# Patient Record
Sex: Male | Born: 1990 | Race: White | Hispanic: No | Marital: Single
Health system: Southern US, Community
[De-identification: ages and names within clinical notes are randomized; demographics above are authoritative.]

---

## 2021-01-26 ENCOUNTER — Emergency Department (HOSPITAL_COMMUNITY)
Admission: EM | Admit: 2021-01-26 | Discharge: 2021-01-26 | Disposition: A | Discharge status: Home / Self Care | Payer: BC Managed Care – PPO | Attending: Emergency Medicine | Admitting: Emergency Medicine

## 2021-01-26 ENCOUNTER — Emergency Department (HOSPITAL_COMMUNITY): Payer: BC Managed Care – PPO

## 2021-01-26 ENCOUNTER — Other Ambulatory Visit: Payer: Self-pay

## 2021-01-26 DIAGNOSIS — R4589 Other symptoms and signs involving emotional state: Secondary | ICD-10-CM

## 2021-01-26 DIAGNOSIS — F419 Anxiety disorder, unspecified: Secondary | ICD-10-CM | POA: Diagnosis not present

## 2021-01-26 DIAGNOSIS — M545 Low back pain, unspecified: Secondary | ICD-10-CM | POA: Diagnosis not present

## 2021-01-26 DIAGNOSIS — F22 Delusional disorders: Secondary | ICD-10-CM | POA: Diagnosis not present

## 2021-01-26 DIAGNOSIS — M549 Dorsalgia, unspecified: Secondary | ICD-10-CM | POA: Diagnosis present

## 2021-01-26 DIAGNOSIS — F418 Other specified anxiety disorders: Secondary | ICD-10-CM

## 2021-01-26 DIAGNOSIS — R200 Anesthesia of skin: Secondary | ICD-10-CM | POA: Diagnosis not present

## 2021-01-26 LAB — CBC WITH DIFFERENTIAL/PLATELET
Abs Immature Granulocytes: 0.05 10*3/uL (ref 0.00–0.07)
Basophils Absolute: 0 10*3/uL (ref 0.0–0.1)
Basophils Relative: 0 %
Eosinophils Absolute: 0 10*3/uL (ref 0.0–0.5)
Eosinophils Relative: 0 %
HCT: 46.1 % (ref 39.0–52.0)
Hemoglobin: 15.5 g/dL (ref 13.0–17.0)
Immature Granulocytes: 0 %
Lymphocytes Relative: 18 %
Lymphs Abs: 2.4 10*3/uL (ref 0.7–4.0)
MCH: 30.2 pg (ref 26.0–34.0)
MCHC: 33.6 g/dL (ref 30.0–36.0)
MCV: 89.9 fL (ref 80.0–100.0)
Monocytes Absolute: 1.3 10*3/uL — ABNORMAL HIGH (ref 0.1–1.0)
Monocytes Relative: 10 %
Neutro Abs: 9.3 10*3/uL — ABNORMAL HIGH (ref 1.7–7.7)
Neutrophils Relative %: 72 %
Platelets: 464 10*3/uL — ABNORMAL HIGH (ref 150–400)
RBC: 5.13 MIL/uL (ref 4.22–5.81)
RDW: 11.7 % (ref 11.5–15.5)
WBC: 13.1 10*3/uL — ABNORMAL HIGH (ref 4.0–10.5)
nRBC: 0 % (ref 0.0–0.2)

## 2021-01-26 LAB — BASIC METABOLIC PANEL
Anion gap: 12 (ref 5–15)
BUN: 16 mg/dL (ref 6–20)
CO2: 24 mmol/L (ref 22–32)
Calcium: 10.1 mg/dL (ref 8.9–10.3)
Chloride: 102 mmol/L (ref 98–111)
Creatinine, Ser: 1.13 mg/dL (ref 0.61–1.24)
GFR, Estimated: >60 mL/min (ref >60)
Glucose, Bld: 113 mg/dL — ABNORMAL HIGH (ref 70–99)
Potassium: 4 mmol/L (ref 3.5–5.1)
Sodium: 138 mmol/L (ref 135–145)

## 2021-01-26 IMAGING — MR MR CERVICAL SPINE W/O CM
4 of 5 series · 19 of 48 positions shown · non-contrast
Comparison: None.

CLINICAL DATA: Right upper quadrant paresthesias intermittently

EXAM:
MRI CERVICAL SPINE WITHOUT CONTRAST
TECHNIQUE: Multiplanar, multisequence MR imaging of the cervical spine was
performed. No intravenous contrast was administered.

[Series 3: T2 · sagittal · 3.0mm · 0.43mm/px · 4 of 16 slices shown (1 of 2)]
[im 1/16]
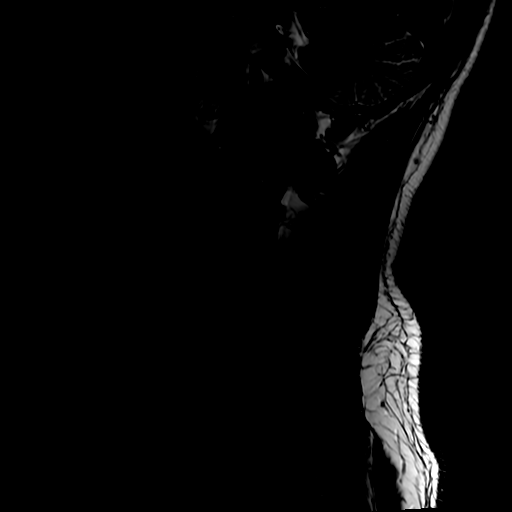
[im 6/16]
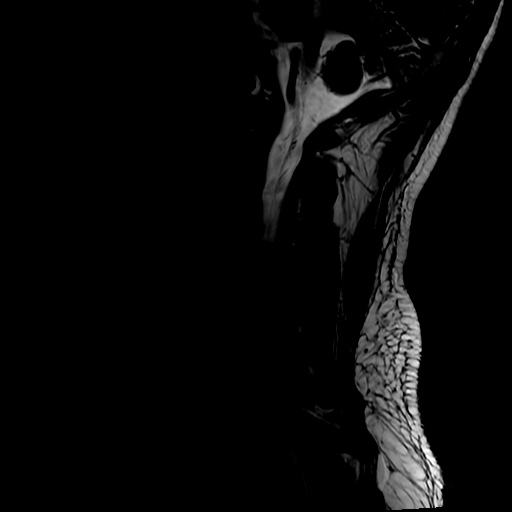
[im 11/16]
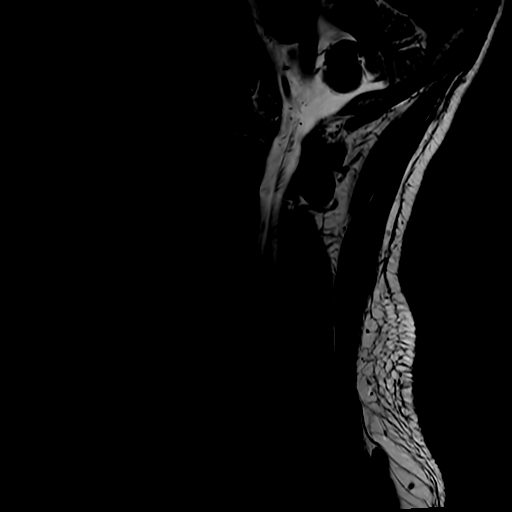
[im 16/16]
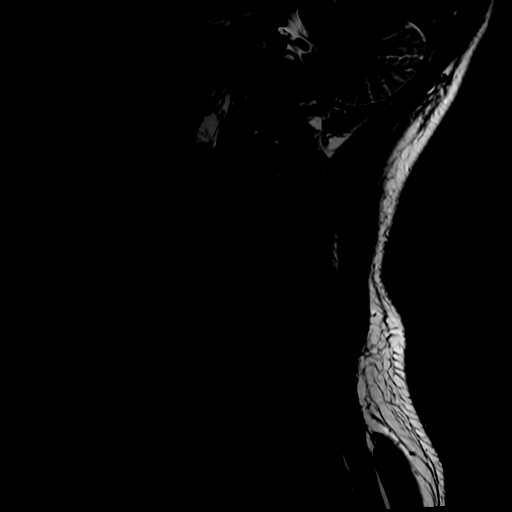

[Series 4: FLAIR · sagittal · 3.0mm · 0.43mm/px · 3 of 16 slices shown]
[im 1/16]
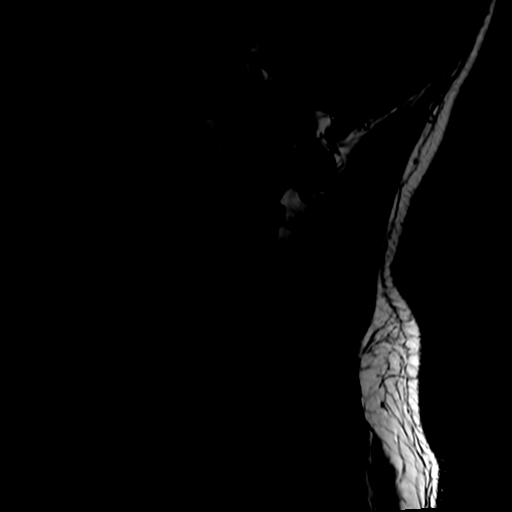
[im 8/16]
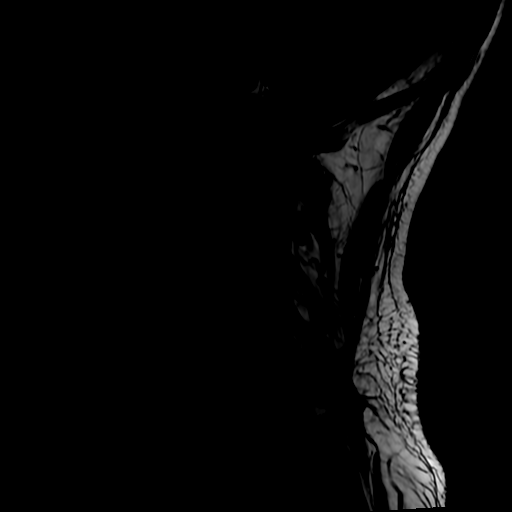
[im 16/16]
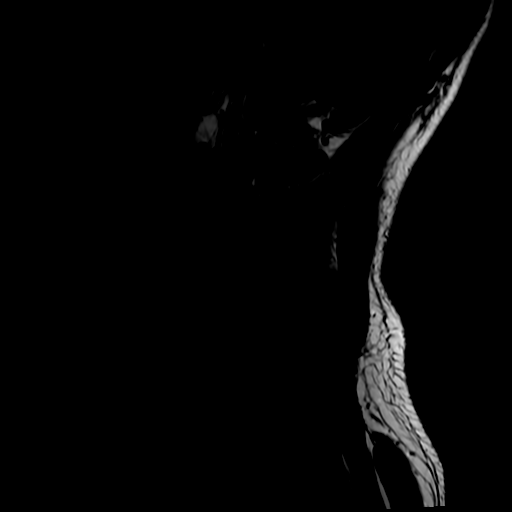

[Series 5: STIR · sagittal · 3.0mm · 0.43mm/px · 3 of 16 slices shown]
[im 1/16]
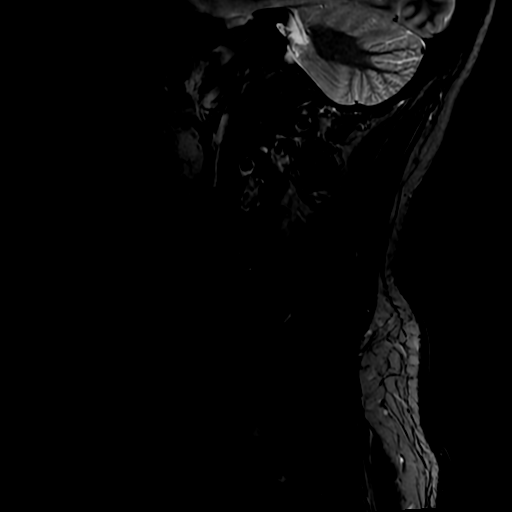
[im 8/16]
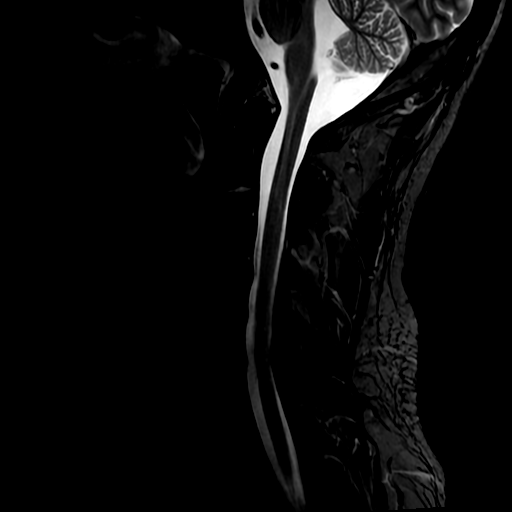
[im 16/16]
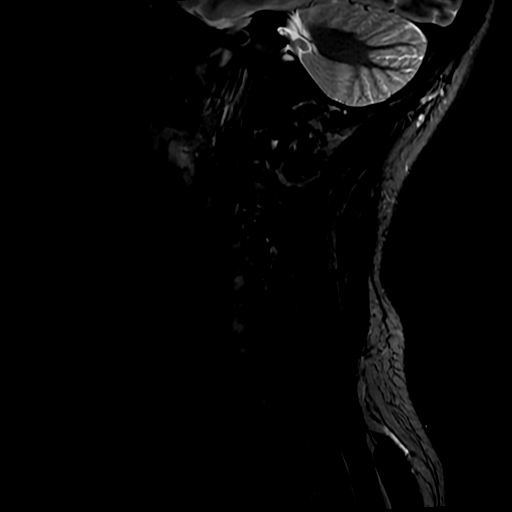

[Series 7: T2 · axial · 3.0mm · 0.35mm/px · z∈[-103,-2]mm · 9 of 32 slices shown (2 of 2)]
[im 1/32]
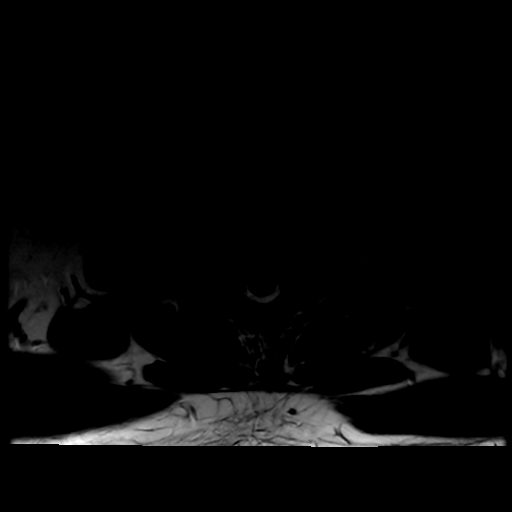
[im 4/32]
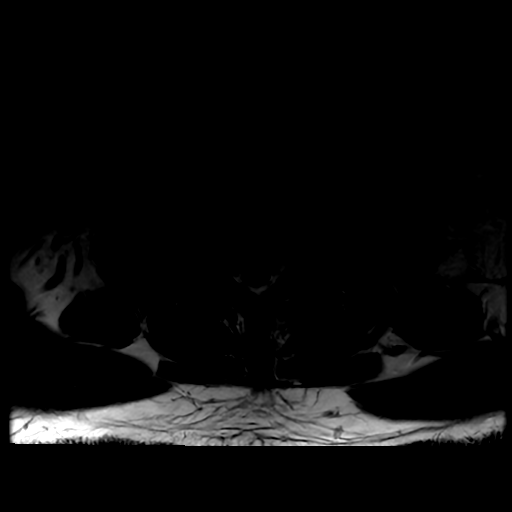
[im 8/32]
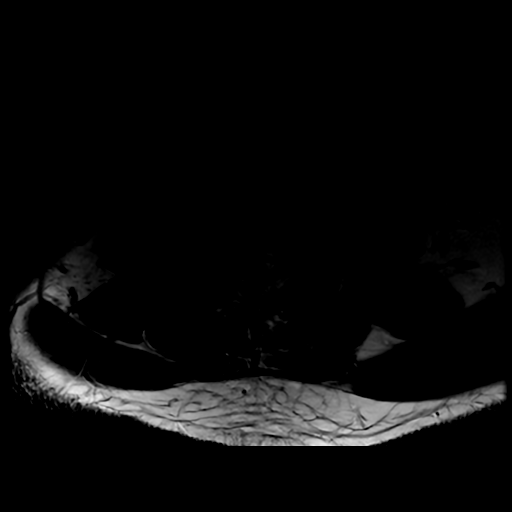
[im 12/32]
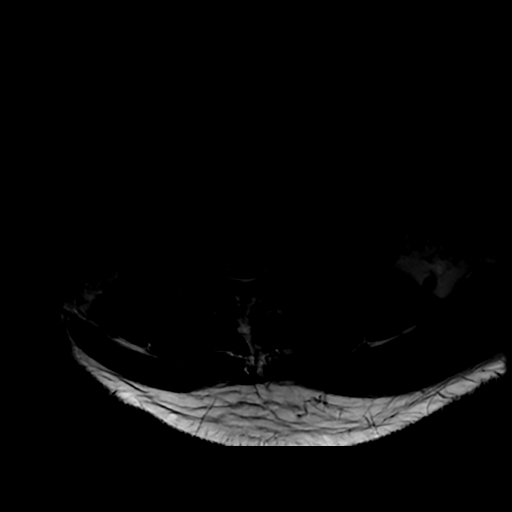
[im 16/32]
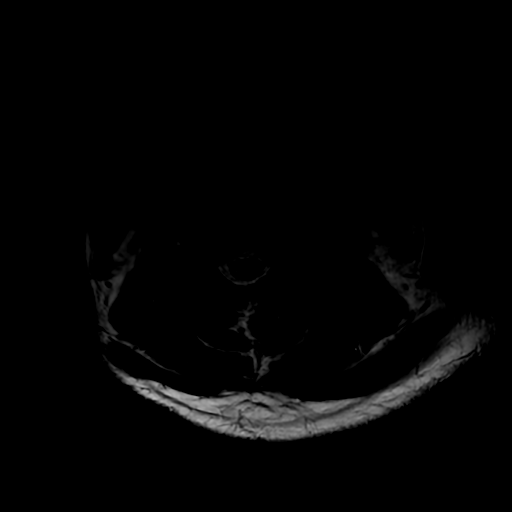
[im 20/32]
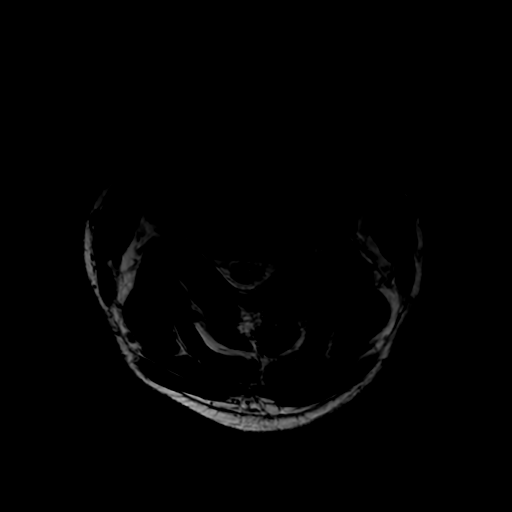
[im 24/32]
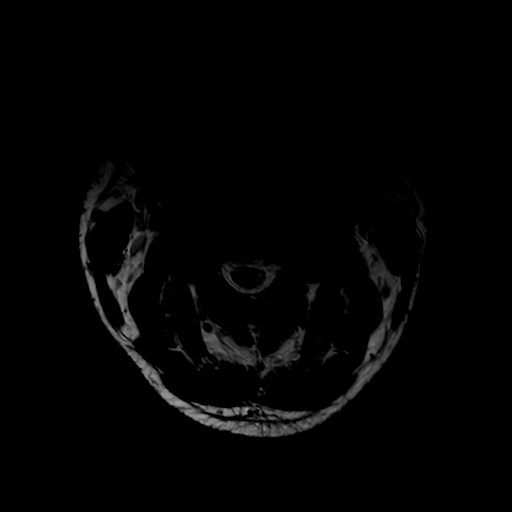
[im 28/32]
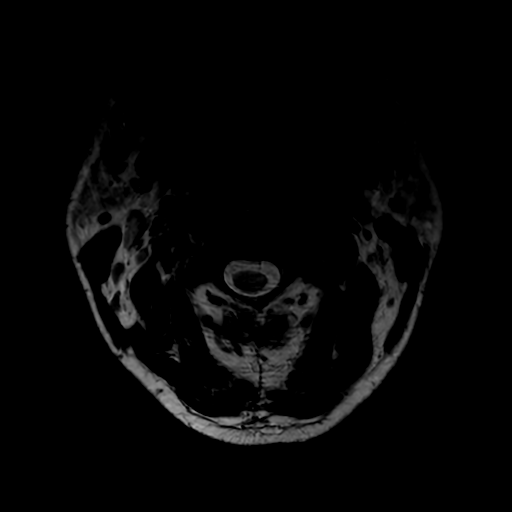
[im 32/32]
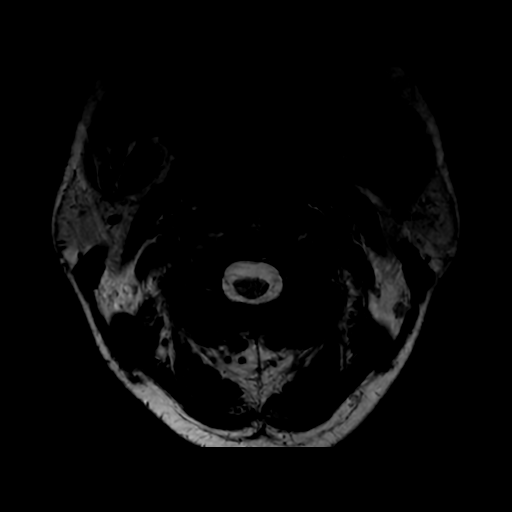

[19 of 48 positions shown; findings below may reference images not displayed]

FINDINGS: Alignment: Normal

Vertebrae: Normal bone marrow.  Negative for fracture or mass

Cord: Spinal cord evaluation limited by motion particularly on the
axial images. Allowing for motion, no cord signal abnormality.

Posterior Fossa, vertebral arteries, paraspinal tissues: Negative

Disc levels:

C2-3: Negative

C3-4: Negative

C4-5: Mild disc degeneration and mild left-sided uncinate spurring.
Mild left foraminal narrowing. Right foramen widely patent

C5-6: Negative

C6-7: Negative

C7-T1: Negative
IMPRESSION: Motion degraded study.

Mild disc degeneration and spurring on the left at C4-5. Mild left
foraminal narrowing

## 2021-01-26 IMAGING — CT CT HEAD W/O CM
4 series · 16 of 47 positions shown, 18 images · non-contrast
Comparison: No pertinent prior exams available for comparison.

CLINICAL DATA: Provided history: Anxious.  Anxiety.

EXAM:
CT HEAD WITHOUT CONTRAST
TECHNIQUE: Contiguous axial images were obtained from the base of the skull
through the vertex without intravenous contrast.

[Series 3: head without · axial · non-contrast · 0.44mm/px · z∈[-143,-13]mm · 7 of 36 slices shown, 9 images]
[im 5/36  brain]
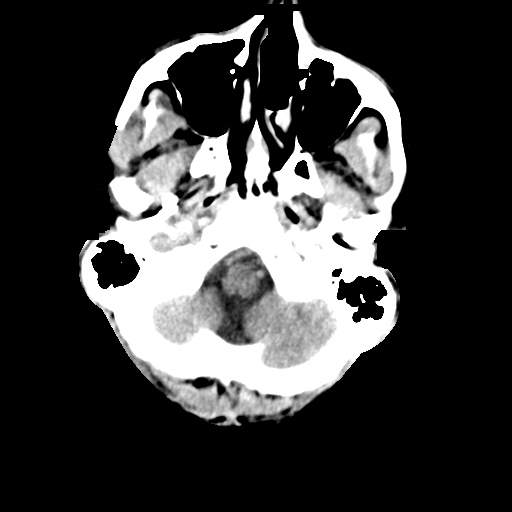
[im 5/36  bone]
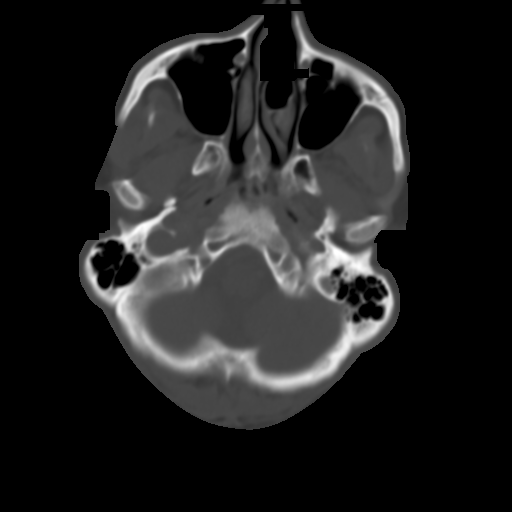
[im 9/36  brain]
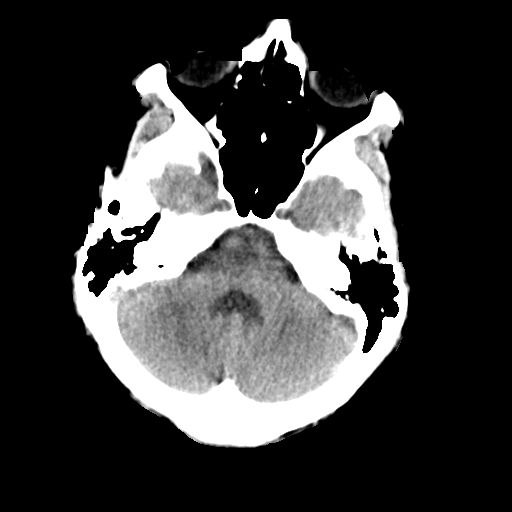
[im 14/36  brain]
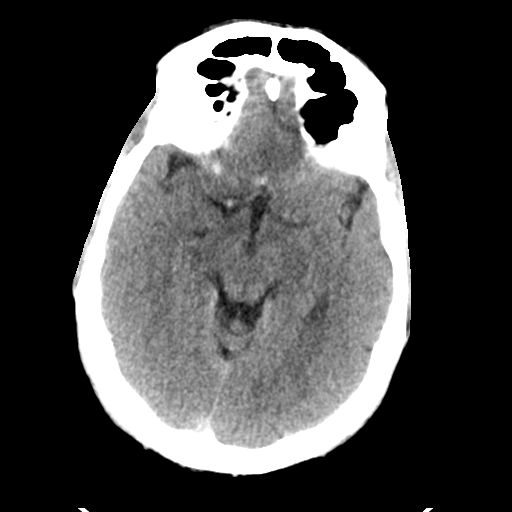
[im 18/36  brain]
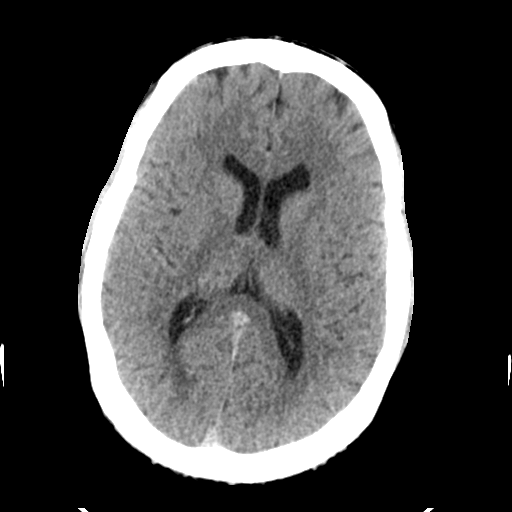
[im 22/36  brain]
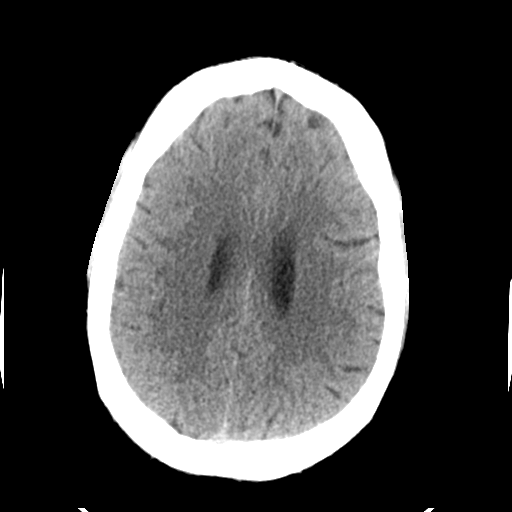
[im 22/36  bone]
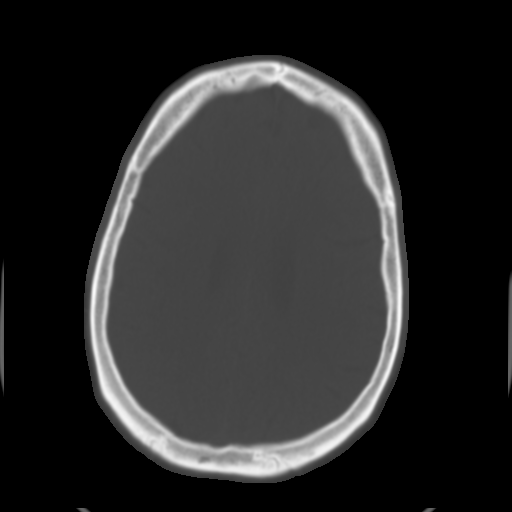
[im 27/36  brain]
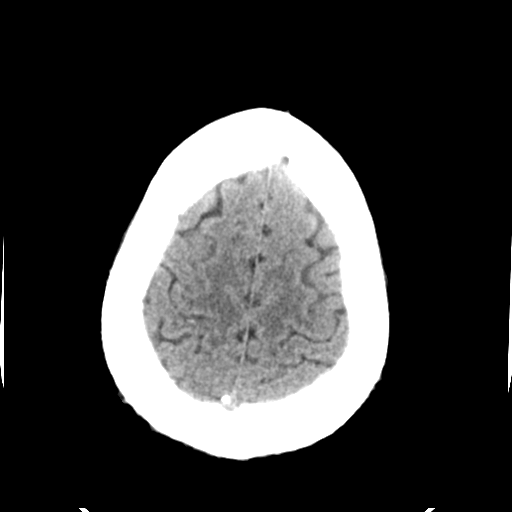
[im 31/36  brain]
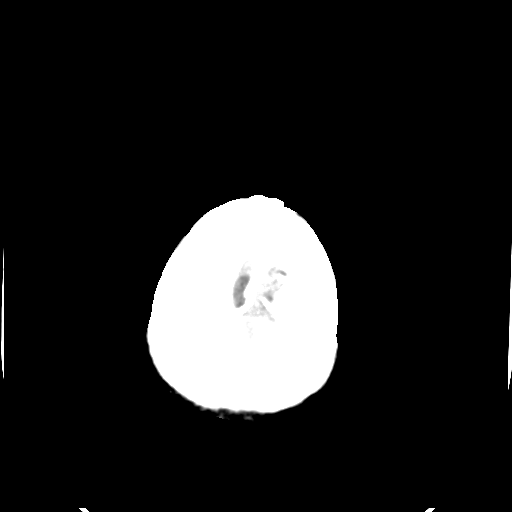

[Series 4: head bone · axial · 0.44mm/px · z∈[-147,-111]mm · 3 of 88 slices shown]
[im 9/88  bone]
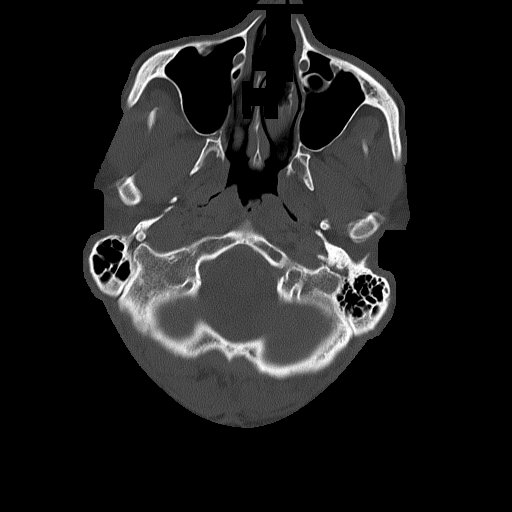
[im 18/88  bone]
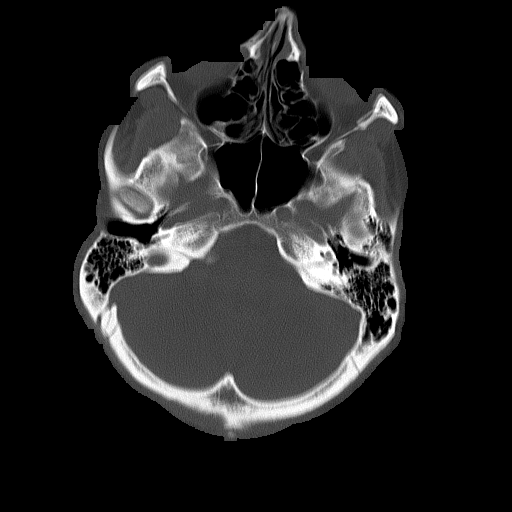
[im 27/88  bone]
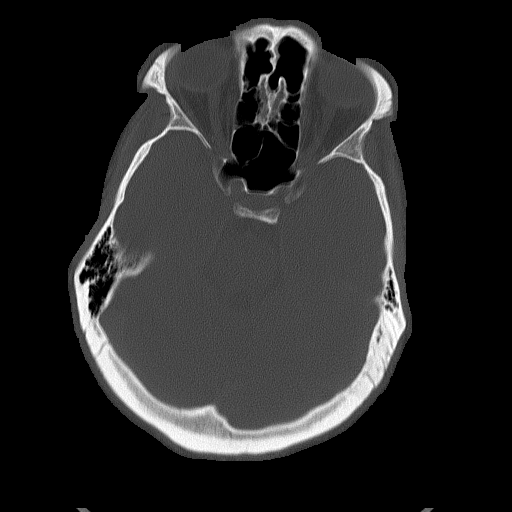

[Series 5: head without cor · coronal · non-contrast · 0.34mm/px · 3 of 76 slices shown]
[im 26/76  brain]
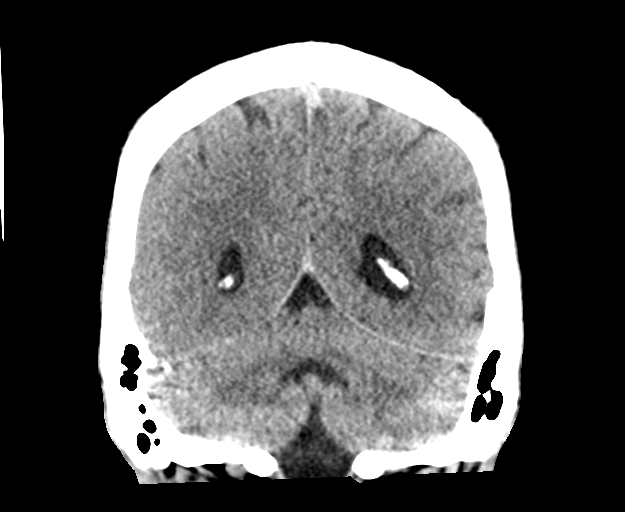
[im 34/76  brain]
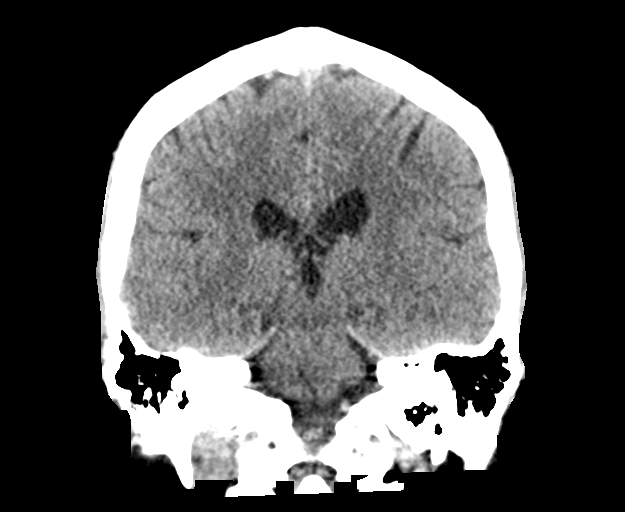
[im 42/76  brain]
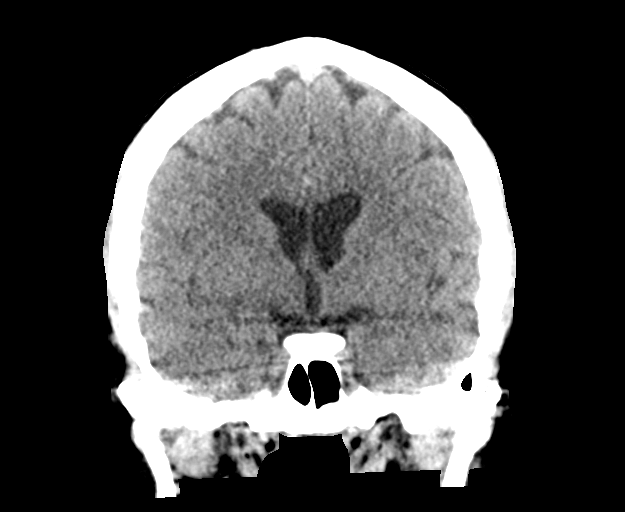

[Series 6: head without sag · sagittal · non-contrast · 0.38mm/px · 3 of 65 slices shown]
[im 22/65  brain]
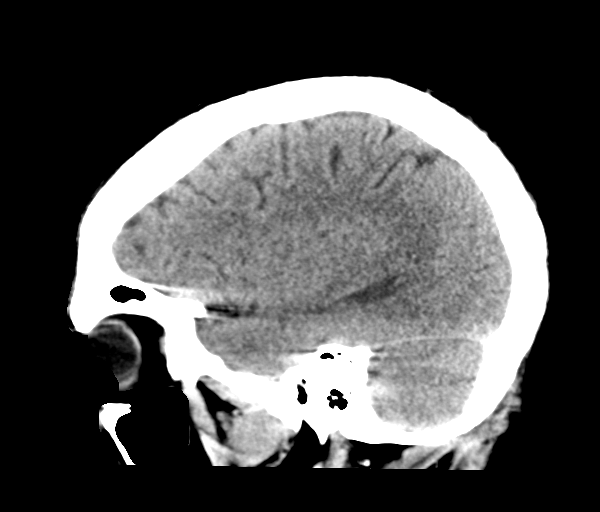
[im 33/65  brain]
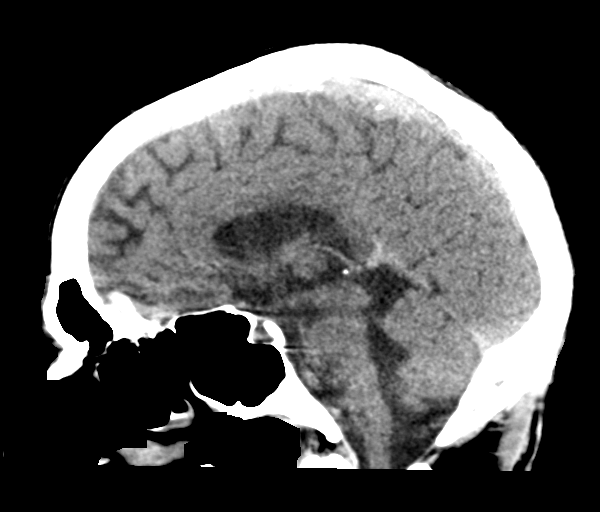
[im 43/65  brain]
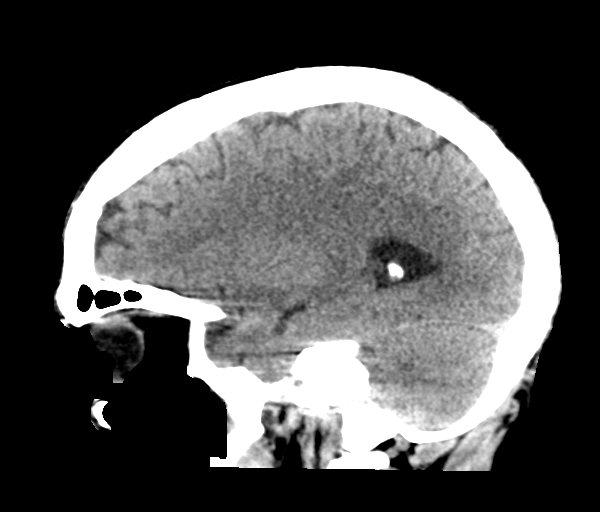

[16 of 47 positions shown; findings below may reference images not displayed]

FINDINGS: Brain:

Cerebral volume is normal.

There is no acute intracranial hemorrhage.

No demarcated cortical infarct.

No extra-axial fluid collection.

No evidence of intracranial mass.

No midline shift.

Vascular: Hyperdense vessel.

Skull: Normal. Negative for fracture or focal lesion.

Sinuses/Orbits: Visualized orbits show no acute finding. Tiny right
maxillary sinus mucous retention cyst.
IMPRESSION: Unremarkable non-contrast CT appearance of the brain. No evidence of
acute intracranial abnormality.

Incidentally noted tiny right maxillary sinus mucous retention cyst.

## 2021-01-26 IMAGING — MR MR LUMBAR SPINE W/O CM
4 of 5 series · 19 of 48 positions shown · non-contrast
Comparison: None.

CLINICAL DATA: Low back pain, cauda equina suspected.

EXAM:
MRI LUMBAR SPINE WITHOUT CONTRAST
TECHNIQUE: Multiplanar, multisequence MR imaging of the lumbar spine was
performed. No intravenous contrast was administered.

[Series 3: T2 · sagittal · 4.0mm · 0.55mm/px · 6 of 14 slices shown (1 of 2)]
[im 1/14]
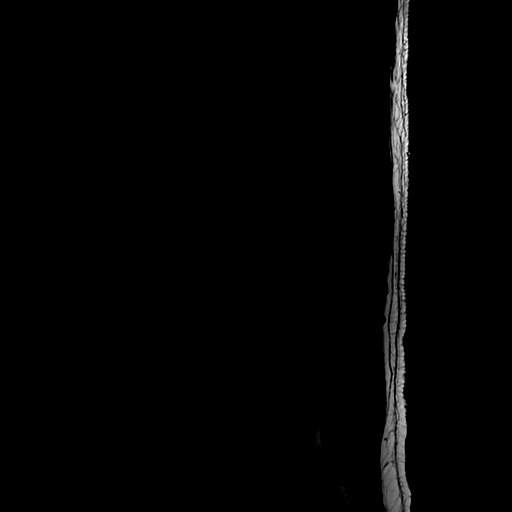
[im 3/14]
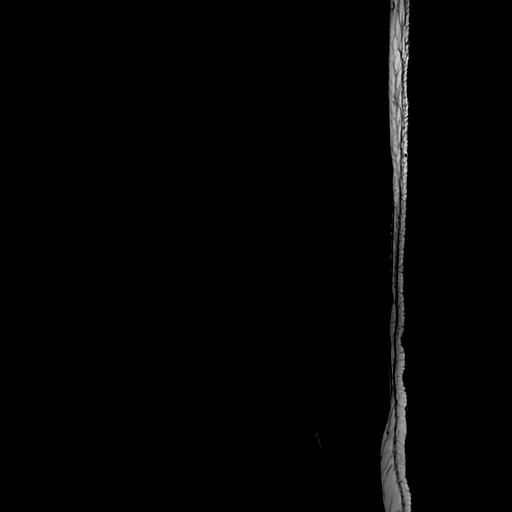
[im 6/14]
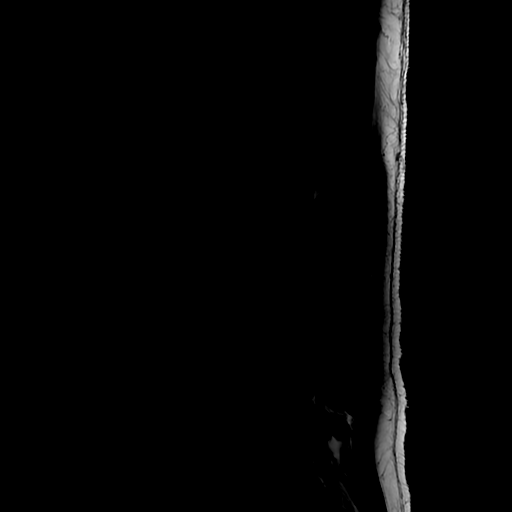
[im 8/14]
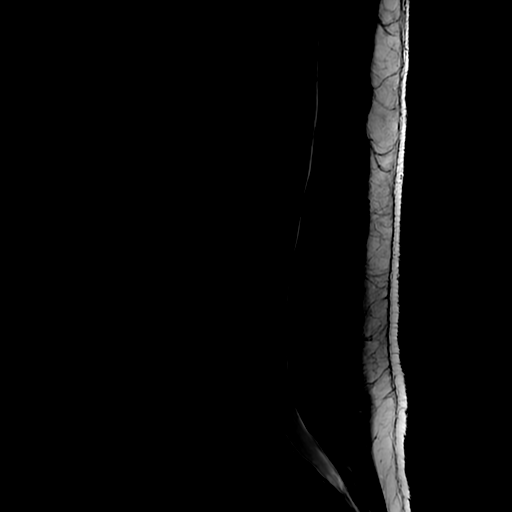
[im 11/14]
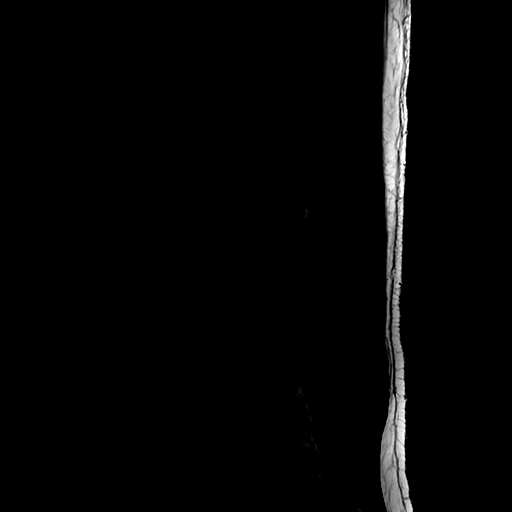
[im 14/14]
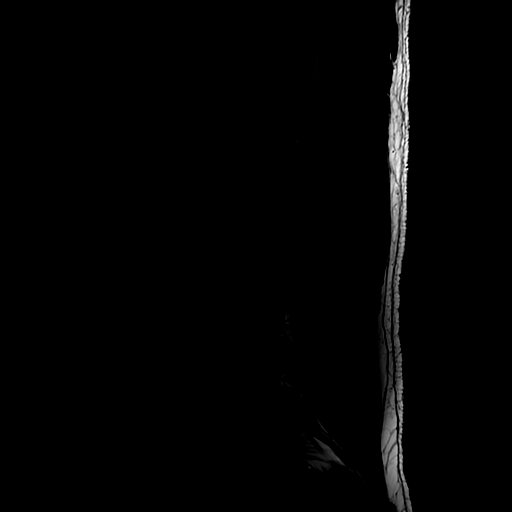

[Series 5: T1 · sagittal · 4.0mm · 0.55mm/px · 3 of 15 slices shown (1 of 2)]
[im 3/15]
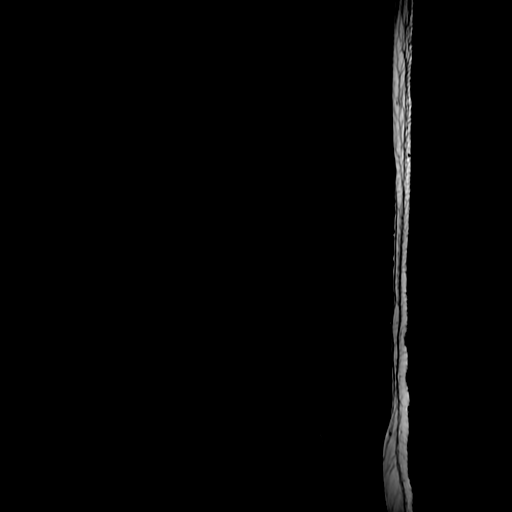
[im 9/15]
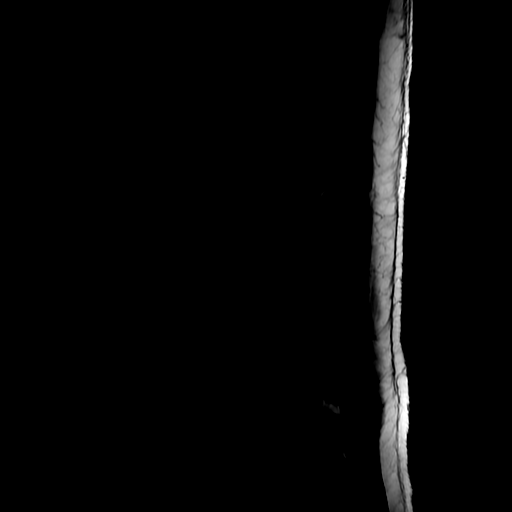
[im 15/15]
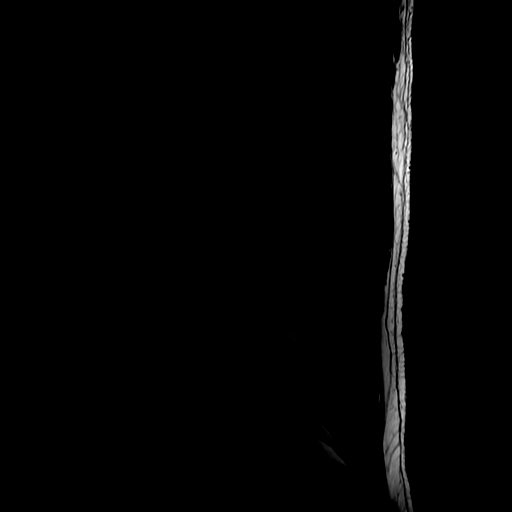

[Series 6: T2 · axial · 4.0mm · 0.39mm/px · z∈[-566,-394]mm · 7 of 35 slices shown (2 of 2)]
[im 1/35]
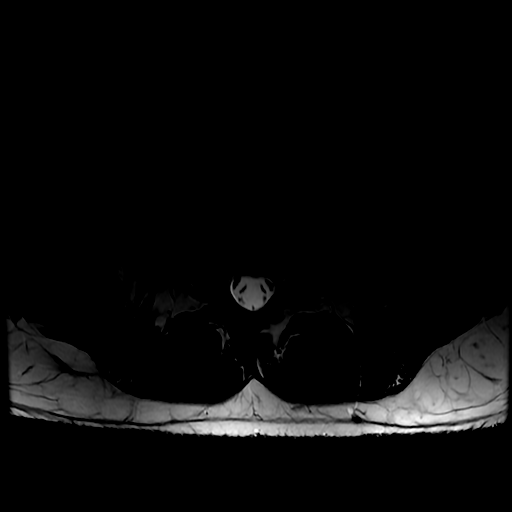
[im 5/35]
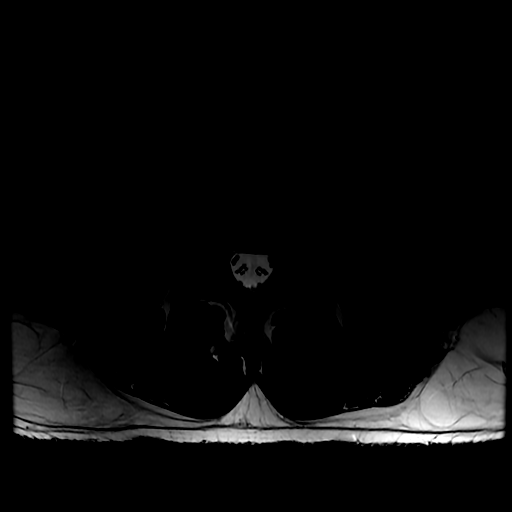
[im 10/35]
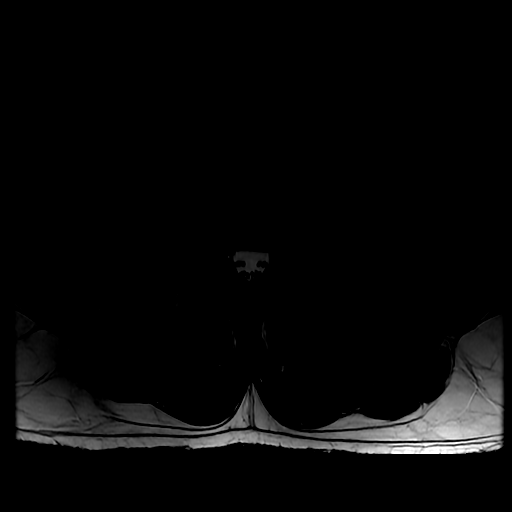
[im 15/35]
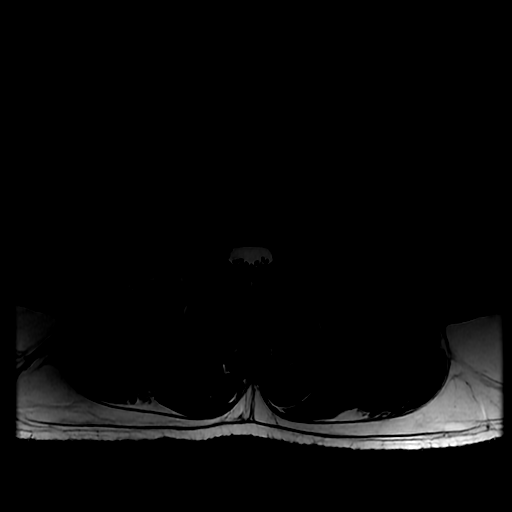
[im 18/35]
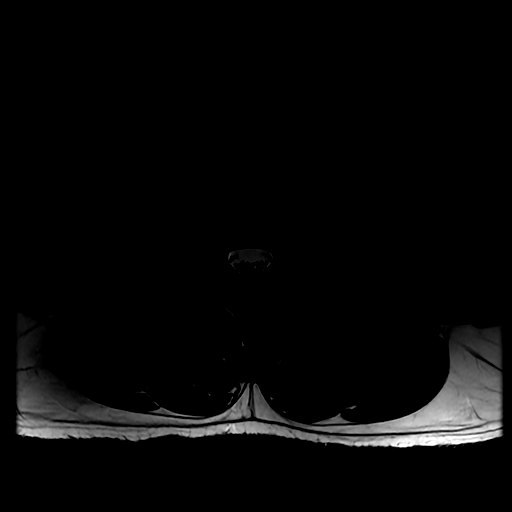
[im 20/35]
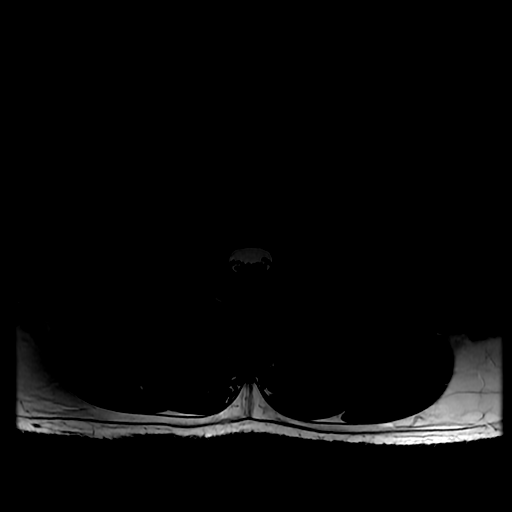
[im 30/35]
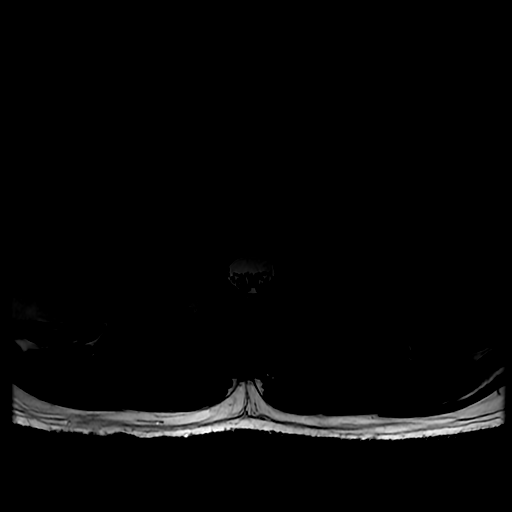

[Series 7: T1 · axial · 4.0mm · 0.39mm/px · z∈[-547,-394]mm · 3 of 35 slices shown (2 of 2)]
[im 5/35]
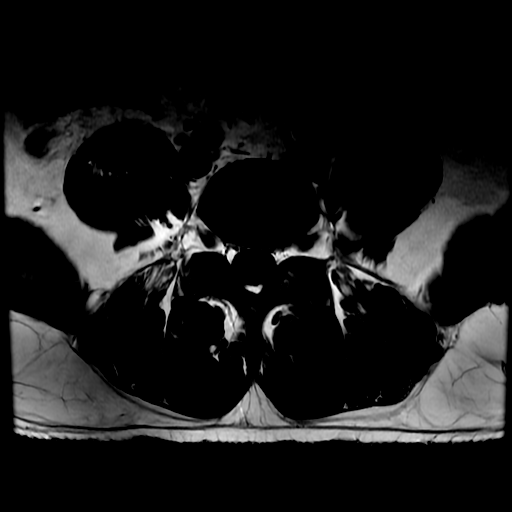
[im 18/35]
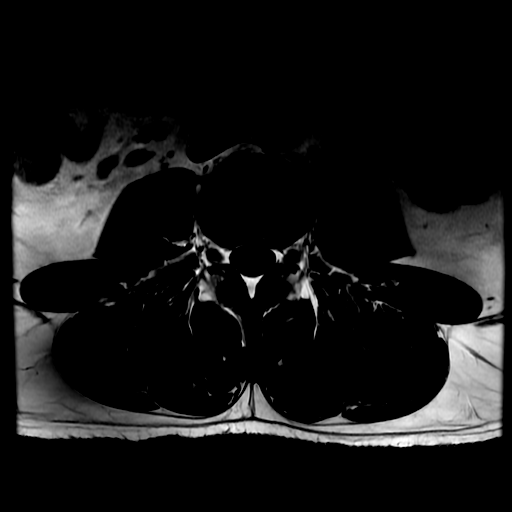
[im 30/35]
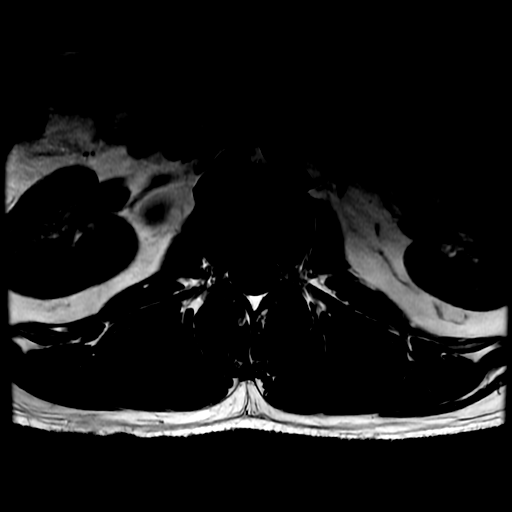

[19 of 48 positions shown; findings below may reference images not displayed]

FINDINGS: Segmentation: Standard segmentation is assumed. The inferior-most
fully formed intervertebral disc is labeled L5-S1.

Alignment:  Straightening without substantial sagittal subluxation.

Vertebrae: Vertebral body heights are maintained. No specific
evidence of acute fracture or discitis/osteomyelitis. No suspicious
bone lesion. Mild diffuse T1 hypointensity of the marrow, which is
nonspecific but can be seen with chronic anemia or chronic hypoxia
(such as in smokers).

Conus medullaris and cauda equina: Conus extends to the L2 level.
Conus appears normal.

Paraspinal and other soft tissues: Unremarkable.

Disc levels: No significant disc protrusion, foraminal stenosis, or
canal stenosis.
IMPRESSION: No significant canal or foraminal stenosis.

## 2021-01-26 NOTE — ED Notes (Signed)
Called MRI. MRI will bring pt to [facility]

## 2021-01-26 NOTE — ED Provider Notes (Signed)
Emergency Medicine Provider Triage Evaluation Note  Wesley Miller , a 30 y.o. male  was evaluated in triage.  Pt complains of 2 weeks of gradual weight loss of 15 pounds unintentionally, as well as episode of low back pain and "popping" 6 days ago which resulted in gradual onset of numbness and decreased sensation to light touch in his groin, as well as difficulty passing urine.  States he is only urinated twice in the last 3 days and are both this morning.  He is ambulatory and denies any numbness, tingling, weakness in his legs but does endorse sensation of "swaying and loss of all the muscle in my back".  Patient is very anxious in triage.  Additionally endorses intermittent numbness and tingling down his right arm with certain positions/postures.  Denies any history of IV drug use, fevers, chills at home.  Denies any chest pain or shortness of breath, states he has not had a bowel movement  Review of Systems  Positive: Saddle anesthesia, urinary retention, constipation, back pain, numbness and tingling in the right upper extremity intermittently. Negative: Neck pain, headache, blurry vision, double vision, chest pain or shortness of breath, palpitations  Physical Exam  BP (!) 161/109   Pulse (!) 120   Temp 98.4 F (36.9 C) (Oral)   Resp 18   SpO2 99%  Gen:   Awake, no distress   Resp:  Normal effort  MSK:   Moves extremities without difficulty  Other:  Symmetric strength and sensation in plantar flexion/dorsiflexion, as well as knee flexion extension bilaterally.  Symmetric grip strength bilaterally, generally weak with forceful motions with the upper extremities.  Sensation is symmetric and normal in upper and lower extremities at this time.  PERRL, EOMI, cranial nerves are intact.  Patient is tachycardic.   Medical Decision Making  Medically screening exam initiated at 12:42 PM.  Appropriate orders placed.  Wesley Miller was informed that the remainder of the evaluation will be completed  by another provider, this initial triage assessment does not replace that evaluation, and the importance of remaining in the ED until their evaluation is complete.  This chart was dictated using voice recognition software, Dragon. Despite the best efforts of this provider to proofread and correct errors, errors may still occur which can change documentation meaning.    Sherrilee Gilles 01/28/21 1642    Eber Hong, MD 02/09/21 1444

## 2021-01-26 NOTE — ED Notes (Addendum)
Unable to obtain vitals, pt transported to MRI

## 2021-01-26 NOTE — ED Triage Notes (Signed)
Pt reports lower back pain x 6 days without injury, then two days after initial pain heard a pop in his back and now his private parts are numb and has not urinated or pooped in four days. Reports losing 15 lbs in the last week. Prior to pain pt sts he had noticed his posture was not as good as normal.

## 2021-01-26 NOTE — ED Provider Notes (Signed)
[facility] EMERGENCY DEPARTMENT Provider Note   CSN: [id number] Arrival date & time: 01/26/21  1216     History Chief Complaint  Patient presents with   Back Pain   Numbness    Wesley Miller is a 30 y.o. male who presents for evaluation of concern of intermittent back pain, numbness.  He states symptoms initially began about 4 days ago.  He states that he felt like his "spine separated from his muscles."  He states that he felt like his back was numb.  He was seen in the ED on 01/23/2021 with reassuring exam and was discharged home.  He contacted his PCP for concerns of anxiety.  PCP started him on low-dose of anxiety medication.  He states that 2 days ago, he was at home and felt like he had a "popping sensation" in his lower back.  He states that he then was having some numbness in his back that he felt like went into his groin.  He states that he attempted to have an erection but was unable to do so and felt like his penis was numb.  He states that when he walks, he feels like his torso twists and turns.  He states that he has felt intermittent numbness and tingling to his arms or legs but states that is currently resolved.  Denies any back pain.  Denies any trauma, injury, fall.  He has not noted any fever, chest pain, difficulty breathing, abdominal pain.  He states that he urinated this morning but this was first time in the last several days.  He has not had any urinary or bowel incontinence.  He denies any history of cancer, HIV, IV drug use, back surgery.  The history is provided by the patient.      No past medical history on file.  There are no problems to display for this patient.   The histories are not reviewed yet. Please review them in the "History" navigator section and refresh this SmartLink.     No family history on file.     Home Medications Prior to Admission medications   Medication Sig Start Date End Date Taking? Authorizing Provider   colchicine 0.6 MG tablet Take 0.6 mg by mouth 2 (two) times daily as needed (gout). 01/20/21  Yes [provider]  LORazepam (ATIVAN) 0.5 MG tablet Take 0.5 mg by mouth 3 (three) times daily as needed for anxiety. 01/25/21  Yes [provider]  allopurinol (ZYLOPRIM) 100 MG tablet Take 100 mg by mouth daily. Patient not taking: No sig reported 01/20/21   [provider]  amLODipine (NORVASC) 2.5 MG tablet Take 2.5 mg by mouth daily. Patient not taking: Reported on 01/26/2021 04/15/20   [provider]  escitalopram (LEXAPRO) 5 MG tablet Take 5 mg by mouth daily. Patient not taking: No sig reported 01/25/21   [provider]  hydrOXYzine (ATARAX/VISTARIL) 25 MG tablet Take 50 mg by mouth 2 (two) times daily as needed. Patient not taking: Reported on 01/26/2021 01/26/21   [provider]    Allergies    Prednisone  Review of Systems   Review of Systems  Constitutional:  Negative for fever.  Respiratory:  Negative for cough and shortness of breath.   Cardiovascular:  Negative for chest pain.  Gastrointestinal:  Negative for abdominal pain, nausea and vomiting.  Genitourinary:  Positive for decreased urine volume. Negative for dysuria and hematuria.  Neurological:  Positive for numbness. Negative for headaches.  All other  systems reviewed and are negative.  Physical Exam Updated Vital Signs BP 140/87   Pulse 71   Temp 98.4 F (36.9 C) (Oral)   Resp 10   Ht 6\' 2"  (1.88 m)   Wt 88.5 kg   SpO2 100%   BMI 25.04 kg/m   Physical Exam Vitals and nursing note reviewed.  Constitutional:      Appearance: Normal appearance. He is well-developed.  HENT:     Head: Normocephalic and atraumatic.  Eyes:     General: Lids are normal.     Conjunctiva/sclera: Conjunctivae normal.     Pupils: Pupils are equal, round, and reactive to light.  Neck:     Comments: Full flexion/extension and lateral movement of neck fully intact. No bony midline  tenderness. No deformities or crepitus.  Cardiovascular:     Rate and Rhythm: Normal rate and regular rhythm.     Pulses: Normal pulses.          Radial pulses are 2+ on the right side and 2+ on the left side.       Dorsalis pedis pulses are 2+ on the right side and 2+ on the left side.     Heart sounds: Normal heart sounds. No murmur heard.   No friction rub. No gallop.  Pulmonary:     Effort: Pulmonary effort is normal.     Breath sounds: Normal breath sounds.     Comments: Lungs clear to auscultation bilaterally.  Symmetric chest rise.  No wheezing, rales, rhonchi. Abdominal:     Palpations: Abdomen is soft. Abdomen is not rigid.     Tenderness: There is no abdominal tenderness. There is no guarding.     Comments: Abdomen is soft, non-distended, non-tender. No rigidity, No guarding. No peritoneal signs.  Genitourinary:    Comments: The exam was performed with a chaperone present , Lanora Manis).  Endorses full sensation to bilateral testicles, perineum, penis.  Bilateral cremasteric reflex intact.  No overlying warmth, erythema, edema of bilateral testicles. Musculoskeletal:        General: Normal range of motion.     Cervical back: Full passive range of motion without pain.     Comments: No midline T or L spine tenderness. No deformity or stepoff. No overlying warmth, edema, erythema. Flexion/extension of the back intact. He can bend down and touch his toes.   Skin:    General: Skin is warm and dry.     Capillary Refill: Capillary refill takes less than 2 seconds.  Neurological:     Mental Status: He is alert and oriented to person, place, and time.     Comments: Cranial nerves III-XII intact Follows commands, Moves all extremities  5/5 strength to BUE and BLE  Sensation intact throughout all major nerve distributions No gait abnormalities  No slurred speech. No facial droop.   Psychiatric:        Mood and Affect: Mood is anxious.        Speech: Speech normal.        Thought  Content: Thought content is paranoid. Thought content does not include homicidal or suicidal ideation. Thought content does not include homicidal or suicidal plan.     Comments: Anxious. Some health paranoia noted. No SI/HI.     ED Results / Procedures / Treatments   Labs (all labs ordered are listed, but only abnormal results are displayed) Labs Reviewed  CBC WITH DIFFERENTIAL/PLATELET - Abnormal; Notable for the following components:      Result Value  WBC 13.1 (*)    Platelets 464 (*)    Neutro Abs 9.3 (*)    Monocytes Absolute 1.3 (*)    All other components within normal limits  BASIC METABOLIC PANEL - Abnormal; Notable for the following components:   Glucose, Bld 113 (*)    All other components within normal limits    EKG None  Radiology CT Head Wo Contrast  Result Date: 01/26/2021 CLINICAL DATA:  Provided history: Anxious.  Anxiety. EXAM: CT HEAD WITHOUT CONTRAST TECHNIQUE: Contiguous axial images were obtained from the base of the skull through the vertex without intravenous contrast. COMPARISON:  No pertinent prior exams available for comparison. FINDINGS: Brain: Cerebral volume is normal. There is no acute intracranial hemorrhage. No demarcated cortical infarct. No extra-axial fluid collection. No evidence of intracranial mass. No midline shift. Vascular: Hyperdense vessel. Skull: Normal. Negative for fracture or focal lesion. Sinuses/Orbits: Visualized orbits show no acute finding. Tiny right maxillary sinus mucous retention cyst. IMPRESSION: Unremarkable non-contrast CT appearance of the brain. No evidence of acute intracranial abnormality. Incidentally noted tiny right maxillary sinus mucous retention cyst. Electronically Signed   By: Jackey LogeKyle  Golden DO   On: 01/26/2021 17:07   MR Cervical Spine Wo Contrast  Result Date: 01/26/2021 CLINICAL DATA:  Right upper quadrant paresthesias intermittently EXAM: MRI CERVICAL SPINE WITHOUT CONTRAST TECHNIQUE: Multiplanar, multisequence  MR imaging of the cervical spine was performed. No intravenous contrast was administered. COMPARISON:  None. FINDINGS: Alignment: Normal Vertebrae: Normal bone marrow.  Negative for fracture or mass Cord: Spinal cord evaluation limited by motion particularly on the axial images. Allowing for motion, no cord signal abnormality. Posterior Fossa, vertebral arteries, paraspinal tissues: Negative Disc levels: C2-3: Negative C3-4: Negative C4-5: Mild disc degeneration and mild left-sided uncinate spurring. Mild left foraminal narrowing. Right foramen widely patent C5-6: Negative C6-7: Negative C7-T1: Negative IMPRESSION: Motion degraded study. Mild disc degeneration and spurring on the left at C4-5. Mild left foraminal narrowing Electronically Signed   By: Marlan Palauharles  Clark M.D.   On: 01/26/2021 14:34   MR LUMBAR SPINE WO CONTRAST  Result Date: 01/26/2021 CLINICAL DATA:  Low back pain, cauda equina suspected. EXAM: MRI LUMBAR SPINE WITHOUT CONTRAST TECHNIQUE: Multiplanar, multisequence MR imaging of the lumbar spine was performed. No intravenous contrast was administered. COMPARISON:  None. FINDINGS: Segmentation: Standard segmentation is assumed. The inferior-most fully formed intervertebral disc is labeled L5-S1. Alignment:  Straightening without substantial sagittal subluxation. Vertebrae: Vertebral body heights are maintained. No specific evidence of acute fracture or discitis/osteomyelitis. No suspicious bone lesion. Mild diffuse T1 hypointensity of the marrow, which is nonspecific but can be seen with chronic anemia or chronic hypoxia (such as in smokers). Conus medullaris and cauda equina: Conus extends to the L2 level. Conus appears normal. Paraspinal and other soft tissues: Unremarkable. Disc levels: No significant disc protrusion, foraminal stenosis, or canal stenosis. IMPRESSION: No significant canal or foraminal stenosis. Electronically Signed   By: Feliberto HartsFrederick S Jones MD   On: 01/26/2021 14:24     Procedures Procedures   Medications Ordered in ED Medications - No data to display  ED Course  I have reviewed the triage vital signs and the nursing notes.  Pertinent labs & imaging results that were available during my care of the patient were reviewed by me and considered in my medical decision making (see chart for details).    MDM Rules/Calculators/A&P  30 year old male who presents for evaluation of concern for back pain, numbness/tingling.  He states that he has had intermittent numbness and tingling in his arms and legs that have been ongoing for the last several days.  About 2 days ago, he felt like a popping sensation in his back and feels like "his spine disassociated from his body."  He states he is also having some numbness in his groin.  He went to the ED at [facility] and had a reassuring work-up but states that he came here because he has not gotten full answers.  Patient states that he has been able to ambulate but he feels like sometimes his torso turns when he ambulates.  Denies any history of IV drug use.  Denies any history of alcohol, cocaine, heroin use.  No HIV, no cancer history.  On initial arrival, he is afebrile nontoxic-appearing.  Vital signs are stable.  Does appear slightly paranoid and anxious but denies any SI, HI.  On my exam, he reports full sensation.  GU exam done with chaperone and shows no evidence of numbness/tingling.  Patient able to ambulate without difficulty.  History/physical exam not concerning for discitis, cauda equina, spinal abscess.  Labs, imaging ordered at triage.  Patient does tell me that he has felt anxious and is worried about possible diagnosis.  He is always been an anxious person per his mom.  He does appear slightly paranoid today on exam but is ANO x3 and denies any SI, HI.  No indication for involuntary commitment at this time.  CBC shows leukocytosis of 13.1. BMP shows normal BUN/Cr.   MRI shows motion  degraded study.  There is mild disc degeneration and spurring of the left C4-C5.  MRI lumbar spine shows no acute abnormalities.  CT head shows no acute intracranial normality.  Patient ambulatory here in the ED with no signs of gait ataxia.  He states all numbness is gone.  He has no numbness of his groin.  Patient is very anxious and paranoid.  He denies any SI, HI.  He is alert and oriented x3.  At this time, he does not meet criteria for IVC.  Will give outpatient resources.  Mom tells me that he has been on prednisone for treatment of gout.  This might explain his leukocytosis as well as some new anxiety.  I discussed with patient that he may not need that medication anymore.  At this time, patient is ambulatory in the ED, he is well-appearing.  He denies any SI, HI and exhibits full medical decision-making capacity.  Patient will be given outpatient follow-up with neuro as well as behavioral health urgent care. At this time, patient exhibits no emergent life-threatening condition that require further evaluation in ED. Discussed patient with Dr. Bernette Mayers who is agreeable to plan. Patient had ample opportunity for questions and discussion. All patient's questions were answered with full understanding. Strict return precautions discussed. Patient expresses understanding and agreement to plan.   Portions of this note were generated with Scientist, clinical (histocompatibility and immunogenetics). Dictation errors may occur despite best attempts at proofreading.   Final Clinical Impression(s) / ED Diagnoses Final diagnoses:  Anxiety about health  Acute bilateral low back pain, unspecified whether sciatica present    Rx / DC Orders ED Discharge Orders     None        Rosana Hoes 01/26/21 1735    Pollyann Savoy, MD 01/26/21 1753

## 2021-01-26 NOTE — ED Notes (Signed)
Discharge instructions and follow up care reviewed with pt. Pt verbalized understanding and ambulatory with steady gait to lobby.

## 2021-01-26 NOTE — ED Notes (Signed)
Pt stated he is losing feeling in his hands and they are going numb. Pt also stated they having dark spots on them. Triage RN notified.

## 2021-01-26 NOTE — ED Notes (Signed)
Pt returned from MRI °

## 2021-01-26 NOTE — Discharge Instructions (Addendum)
As we discussed, your work-up and imaging looked reassuring today.  As discussed, your CT head was reassuring and did not show any abnormalities.  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  Your MRI of your neck and back were reassuring. Your lab work was reassuring.   As we discussed, you can follow-up outpatient with neurology for further evaluation management of your symptoms.  Additionally, I provided you information to the behavioral health urgent care patient can follow-up with if you feel like your anxiety is getting worse.  Return emergency department any worsening pain, difficulty walking, thoughts of wanting her to kill yourself or any other worsening concerning symptoms.
